# Patient Record
Sex: Female | Born: 1960 | Race: Black or African American | Hispanic: No | Smoking: Current some day smoker
Health system: Southern US, Community
[De-identification: ages and names within clinical notes are randomized; demographics above are authoritative.]

## PROBLEM LIST (undated history)

## (undated) DIAGNOSIS — E119 Type 2 diabetes mellitus without complications: Secondary | ICD-10-CM

## (undated) HISTORY — PX: HYSTERECTOMY ABDOMINAL WITH SALPINGO-OOPHORECTOMY: SHX6792

---

## 2020-02-18 ENCOUNTER — Ambulatory Visit: Payer: Self-pay | Attending: Internal Medicine

## 2020-02-18 DIAGNOSIS — Z23 Encounter for immunization: Secondary | ICD-10-CM

## 2020-03-14 ENCOUNTER — Ambulatory Visit: Payer: Self-pay

## 2020-03-22 ENCOUNTER — Ambulatory Visit: Payer: Self-pay

## 2020-03-22 ENCOUNTER — Ambulatory Visit: Payer: Self-pay | Attending: Internal Medicine

## 2020-03-22 DIAGNOSIS — Z23 Encounter for immunization: Secondary | ICD-10-CM

## 2020-03-22 NOTE — Progress Notes (Signed)
   Covid-19 Vaccination Clinic  Name:  Kim Patel    MRN: [id number] DOB: 11-21-1961  03/22/2020  Kim Patel was observed post Covid-19 immunization for 15 minutes without incident. She was provided with Vaccine Information Sheet and instruction to access the V-Safe system.   Kim Patel was instructed to call 911 with any severe reactions post vaccine: Marland Kitchen Difficulty breathing  . Swelling of face and throat  . A fast heartbeat  . A bad rash all over body  . Dizziness and weakness   Immunizations Administered    Name Date Dose VIS Date Route   Pfizer COVID-19 Vaccine 03/22/2020  3:46 PM 0.3 mL 01/20/2019 Intramuscular   Manufacturer: [employer], [employer]   Lot: [id number]   NDC: [address]-0

## 2020-11-03 ENCOUNTER — Ambulatory Visit: Payer: Self-pay | Attending: Internal Medicine

## 2020-11-03 DIAGNOSIS — Z23 Encounter for immunization: Secondary | ICD-10-CM

## 2020-11-03 NOTE — Progress Notes (Signed)
   Covid-19 Vaccination Clinic  Name:  Kim Patel    MRN: [id number] DOB: Apr 09, 1961  11/03/2020  [name] was observed post Covid-19 immunization for 15 minutes without incident. She was provided with Vaccine Information Sheet and instruction to access the V-Safe system.   [name] was instructed to call 911 with any severe reactions post vaccine: Marland Kitchen Difficulty breathing  . Swelling of face and throat  . A fast heartbeat  . A bad rash all over body  . Dizziness and weakness   Immunizations Administered    Name Date Dose VIS Date Route   Pfizer COVID-19 Vaccine 11/03/2020  4:04 PM 0.3 mL 09/14/2020 Intramuscular   Manufacturer: [employer], [employer]   Lot: [id number]   NDC: [address]-2

## 2021-04-14 ENCOUNTER — Other Ambulatory Visit: Payer: Self-pay

## 2021-04-14 ENCOUNTER — Encounter (HOSPITAL_BASED_OUTPATIENT_CLINIC_OR_DEPARTMENT_OTHER): Payer: Self-pay | Admitting: Obstetrics and Gynecology

## 2021-04-14 ENCOUNTER — Emergency Department (HOSPITAL_BASED_OUTPATIENT_CLINIC_OR_DEPARTMENT_OTHER)
Admission: EM | Admit: 2021-04-14 | Discharge: 2021-04-14 | Disposition: A | Discharge status: Home / Self Care | Payer: 59 | Attending: Emergency Medicine | Admitting: Emergency Medicine

## 2021-04-14 ENCOUNTER — Emergency Department (HOSPITAL_BASED_OUTPATIENT_CLINIC_OR_DEPARTMENT_OTHER): Payer: 59

## 2021-04-14 DIAGNOSIS — F17210 Nicotine dependence, cigarettes, uncomplicated: Secondary | ICD-10-CM | POA: Insufficient documentation

## 2021-04-14 DIAGNOSIS — K56609 Unspecified intestinal obstruction, unspecified as to partial versus complete obstruction: Secondary | ICD-10-CM | POA: Insufficient documentation

## 2021-04-14 DIAGNOSIS — E119 Type 2 diabetes mellitus without complications: Secondary | ICD-10-CM | POA: Insufficient documentation

## 2021-04-14 DIAGNOSIS — R1084 Generalized abdominal pain: Secondary | ICD-10-CM | POA: Diagnosis present

## 2021-04-14 HISTORY — DX: Type 2 diabetes mellitus without complications: E11.9

## 2021-04-14 LAB — COMPREHENSIVE METABOLIC PANEL
ALT: 10 U/L (ref 0–44)
AST: 15 U/L (ref 15–41)
Albumin: 4.5 g/dL (ref 3.5–5.0)
Alkaline Phosphatase: 76 U/L (ref 38–126)
Anion gap: 10 (ref 5–15)
BUN: 11 mg/dL (ref 6–20)
CO2: 24 mmol/L (ref 22–32)
Calcium: 12.2 mg/dL — ABNORMAL HIGH (ref 8.9–10.3)
Chloride: 97 mmol/L — ABNORMAL LOW (ref 98–111)
Creatinine, Ser: 0.69 mg/dL (ref 0.44–1.00)
GFR, Estimated: >60 mL/min (ref >60)
Glucose, Bld: 338 mg/dL — ABNORMAL HIGH (ref 70–99)
Potassium: 4.7 mmol/L (ref 3.5–5.1)
Sodium: 131 mmol/L — ABNORMAL LOW (ref 135–145)
Total Bilirubin: 0.4 mg/dL (ref 0.3–1.2)
Total Protein: 8.9 g/dL — ABNORMAL HIGH (ref 6.5–8.1)

## 2021-04-14 LAB — URINALYSIS, ROUTINE W REFLEX MICROSCOPIC
Bilirubin Urine: NEGATIVE
Glucose, UA: >1000 mg/dL — AB
Hgb urine dipstick: NEGATIVE
Ketones, ur: 40 mg/dL — AB
Leukocytes,Ua: NEGATIVE
Nitrite: NEGATIVE
Protein, ur: 30 mg/dL — AB
Specific Gravity, Urine: 1.037 — ABNORMAL HIGH (ref 1.005–1.030)
pH: 5.5 (ref 5.0–8.0)

## 2021-04-14 LAB — CBC
HCT: 44.1 % (ref 36.0–46.0)
Hemoglobin: 14.3 g/dL (ref 12.0–15.0)
MCH: 27.9 pg (ref 26.0–34.0)
MCHC: 32.4 g/dL (ref 30.0–36.0)
MCV: 86.1 fL (ref 80.0–100.0)
Platelets: 209 10*3/uL (ref 150–400)
RBC: 5.12 MIL/uL — ABNORMAL HIGH (ref 3.87–5.11)
RDW: 13 % (ref 11.5–15.5)
WBC: 11 10*3/uL — ABNORMAL HIGH (ref 4.0–10.5)
nRBC: 0 % (ref 0.0–0.2)

## 2021-04-14 LAB — LIPASE, BLOOD: Lipase: 28 U/L (ref 11–51)

## 2021-04-14 LAB — PREGNANCY, URINE: Preg Test, Ur: NEGATIVE

## 2021-04-14 MED ORDER — FENTANYL CITRATE (PF) 100 MCG/2ML IJ SOLN
50.0000 ug | Freq: Once | INTRAMUSCULAR | Status: AC
  Administered 2021-04-14: 50 ug via INTRAVENOUS
  Filled 2021-04-14: qty 2

## 2021-04-14 MED ORDER — ONDANSETRON 4 MG PO TBDP: 4.0000 mg | ORAL_TABLET | Freq: Three times a day (TID) | ORAL | 0 refills | Status: AC | PRN

## 2021-04-14 MED ORDER — HYDROCODONE-ACETAMINOPHEN 5-325 MG PO TABS: 1.0000 | ORAL_TABLET | Freq: Four times a day (QID) | ORAL | 0 refills | Status: AC | PRN

## 2021-04-14 MED ORDER — SODIUM CHLORIDE 0.9 % IV BOLUS
1000.0000 mL | Freq: Once | INTRAVENOUS | Status: AC
  Administered 2021-04-14: 1000 mL via INTRAVENOUS

## 2021-04-14 MED ORDER — ONDANSETRON HCL 4 MG/2ML IJ SOLN
4.0000 mg | Freq: Once | INTRAMUSCULAR | Status: AC | PRN
  Administered 2021-04-14: 4 mg via INTRAVENOUS
  Filled 2021-04-14: qty 2

## 2021-04-14 NOTE — ED Notes (Signed)
Patient transported to CT 

## 2021-04-14 NOTE — ED Notes (Signed)
Pt verbalizes understanding of discharge instructions. Opportunity for questioning and answers were provided. Armand removed by staff, pt discharged from ED to home. Pt explained the risks of leaving and to come back if sx worsen.

## 2021-04-14 NOTE — ED Triage Notes (Signed)
Pt reports to the ER for abdominal pain since 1530. Patient reports she has taken 2 tums and has since thrown those up. Patient denies diarrhea and reports a normal BM this morning.  Patient denies urinary symptoms but reports a 'heavy' feeling in her abdomen

## 2021-04-14 NOTE — ED Provider Notes (Signed)
[facility] EMERGENCY DEPARTMENT Provider Note  CSN: [id number] Arrival date & time: 04/14/21 1936    History Chief Complaint  Patient presents with  . Abdominal Pain    HPI  Kim Patel is a 60 y.o. female with history of DM, managed with insulin at home but she does not check her glucose regularly reports onset of diffuse lower abdominal pain about 1530hrs today, she took some tums and pepto and went to lie down at home but she then threw up and pain has been getting progressively worse. She has not had any other vomiting, no diarrhea. Last BM was this morning. She denies any urinary symptoms. She did not know she had a fever.    Past Medical History:  Diagnosis Date  . Diabetes mellitus without complication [facility])     Past Surgical History:  Procedure Laterality Date  . HYSTERECTOMY ABDOMINAL WITH SALPINGO-OOPHORECTOMY      No family history on file.  Social History   Tobacco Use  . Smoking status: Current Some Day Smoker    Packs/day: 0.75    Years: 20.00    Pack years: 15.00    Types: Cigarettes  . Smokeless tobacco: Never Used  Vaping Use  . Vaping Use: Never used  Substance Use Topics  . Alcohol use: Not Currently  . Drug use: Not Currently     Home Medications Prior to Admission medications   Not on File     Allergies    Latex and Sulfa antibiotics   Review of Systems   Review of Systems A comprehensive review of systems was completed and negative except as noted in HPI.    Physical Exam BP (!) 167/86   Pulse 97   Temp (!) 100.8 F (38.2 C) (Oral)   Resp 16   Ht 5\' 6"  (1.676 m)   Wt 63.5 kg   SpO2 100%   BMI 22.60 kg/m   Physical Exam Vitals and nursing note reviewed.  Constitutional:      Appearance: Normal appearance.  HENT:     Head: Normocephalic and atraumatic.     Nose: Nose normal.     Mouth/Throat:     Mouth: Mucous membranes are moist.  Eyes:     Extraocular Movements: Extraocular movements intact.      Conjunctiva/sclera: Conjunctivae normal.  Cardiovascular:     Rate and Rhythm: Normal rate.  Pulmonary:     Effort: Pulmonary effort is normal.     Breath sounds: Normal breath sounds.  Abdominal:     General: Abdomen is flat.     Palpations: Abdomen is soft.     Tenderness: There is abdominal tenderness in the suprapubic area and left lower quadrant. There is guarding. Negative signs include Murphy's sign and McBurney's sign.  Musculoskeletal:        General: No swelling. Normal range of motion.     Cervical back: Neck supple.  Skin:    General: Skin is warm and dry.  Neurological:     General: No focal deficit present.     Mental Status: She is alert.  Psychiatric:        Mood and Affect: Mood normal.      ED Results / Procedures / Treatments   Labs (all labs ordered are listed, but only abnormal results are displayed) Labs Reviewed  COMPREHENSIVE METABOLIC PANEL - Abnormal; Notable for the following components:      Result Value   Sodium 131 (*)    Chloride 97 (*)  Glucose, Bld 338 (*)    Calcium 12.2 (*)    Total Protein 8.9 (*)    All other components within normal limits  CBC - Abnormal; Notable for the following components:   WBC 11.0 (*)    RBC 5.12 (*)    All other components within normal limits  URINALYSIS, ROUTINE W REFLEX MICROSCOPIC - Abnormal; Notable for the following components:   Specific Gravity, Urine 1.037 (*)    Glucose, UA >1,000 (*)    Ketones, ur 40 (*)    Protein, ur 30 (*)    All other components within normal limits  LIPASE, BLOOD  PREGNANCY, URINE    EKG None   Radiology CT Abdomen Pelvis Wo Contrast  Result Date: 04/14/2021 CLINICAL DATA:  Acute generalized abdominal pain. EXAM: CT ABDOMEN AND PELVIS WITHOUT CONTRAST TECHNIQUE: Multidetector CT imaging of the abdomen and pelvis was performed following the standard protocol without IV contrast. COMPARISON:  None. FINDINGS: Lower chest: No acute abnormality. Hepatobiliary: No  focal liver abnormality is seen. No gallstones, gallbladder wall thickening, or biliary dilatation. Pancreas: Unremarkable. No pancreatic ductal dilatation or surrounding inflammatory changes. Spleen: Normal in size without focal abnormality. Adrenals/Urinary Tract: Adrenal glands are unremarkable. Kidneys are normal, without renal calculi, focal lesion, or hydronephrosis. Bladder is unremarkable. Stomach/Bowel: The stomach and appendix appear normal. The colon is unremarkable. Mildly dilated and fluid filled small bowel loops are noted in the pelvis concerning for distal small bowel obstruction or possibly focal ileus. Vascular/Lymphatic: Aortic atherosclerosis. No enlarged abdominal or pelvic lymph nodes. Reproductive: Status post hysterectomy. No adnexal masses. Other: No abdominal wall hernia or abnormality. No abdominopelvic ascites. Musculoskeletal: No acute or significant osseous findings. IMPRESSION: Mildly dilated and fluid-filled small bowel loops are noted in the lower abdomen and pelvis with some fecalization present, concerning for distal small bowel obstruction or possibly ileus. Electronically Signed   By: Lupita Raider M.D.   On: 04/14/2021 20:50    Procedures Procedures  Medications Ordered in the ED Medications  ondansetron [facility]) injection 4 mg (4 mg Intravenous Given 04/14/21 2031)  sodium chloride 0.9 % bolus 1,000 mL (1,000 mLs Intravenous New Bag/Given 04/14/21 2033)  fentaNYL (SUBLIMAZE) injection 50 mcg (50 mcg Intravenous Given 04/14/21 2031)     MDM Rules/Calculators/A&P MDM Patient had labs ordered in Triage, no concerning findings other than hyperglycemia, mild leukocytosis. Will give some pain medications, IVF and check a CT.  ED Course  I have reviewed the triage vital signs and the nursing notes.  Pertinent labs & imaging results that were available during my care of the patient were reviewed by me and considered in my medical decision making (see chart for  details).  Clinical Course as of 04/14/21 2310  Fri Apr 14, 2021  2117 CT shows dilated loops of small bowel concerning for SBO vs ileus. Patient has not had any vomiting.  [CS]  2118 CMP with hyperglycemia without anion gap. Small ketones in urine but otherwise no signs of infection.  [CS]  2212 Reviewed results with the patient. Recommended admission for further management but she states she cannot stay due to obligations at home. Recommend clear liquid diet, will Rx pain/nausea meds for symptom improvement. Advised her to return to the ER as soon as she is able to be admitted if her symptoms persist.  [CS]    Clinical Course User Index [CS] Pollyann Savoy, MD    Final Clinical Impression(s) / ED Diagnoses Final diagnoses:  Small bowel obstruction (HCC)  Rx / DC Orders ED Discharge Orders    None       Pollyann Savoy, MD 04/14/21 2310

## 2021-04-14 NOTE — ED Notes (Signed)
Patient returned from CT

## 2021-04-15 ENCOUNTER — Encounter (HOSPITAL_COMMUNITY): Payer: Self-pay | Admitting: Emergency Medicine

## 2021-04-15 ENCOUNTER — Emergency Department (HOSPITAL_COMMUNITY): Payer: 59

## 2021-04-15 ENCOUNTER — Emergency Department (HOSPITAL_COMMUNITY)
Admission: EM | Admit: 2021-04-15 | Discharge: 2021-04-15 | Disposition: A | Discharge status: Home / Self Care | Payer: 59 | Attending: Emergency Medicine | Admitting: Emergency Medicine

## 2021-04-15 ENCOUNTER — Other Ambulatory Visit: Payer: Self-pay

## 2021-04-15 DIAGNOSIS — R109 Unspecified abdominal pain: Secondary | ICD-10-CM | POA: Insufficient documentation

## 2021-04-15 DIAGNOSIS — Z91040 Latex allergy status: Secondary | ICD-10-CM | POA: Diagnosis not present

## 2021-04-15 DIAGNOSIS — E119 Type 2 diabetes mellitus without complications: Secondary | ICD-10-CM | POA: Diagnosis not present

## 2021-04-15 DIAGNOSIS — F17210 Nicotine dependence, cigarettes, uncomplicated: Secondary | ICD-10-CM | POA: Insufficient documentation

## 2021-04-15 LAB — CBC WITH DIFFERENTIAL/PLATELET
Abs Immature Granulocytes: 0.03 10*3/uL (ref 0.00–0.07)
Basophils Absolute: 0.1 10*3/uL (ref 0.0–0.1)
Basophils Relative: 1 %
Eosinophils Absolute: 0.1 10*3/uL (ref 0.0–0.5)
Eosinophils Relative: 2 %
HCT: 43.1 % (ref 36.0–46.0)
Hemoglobin: 13.7 g/dL (ref 12.0–15.0)
Immature Granulocytes: 0 %
Lymphocytes Relative: 44 %
Lymphs Abs: 3.3 10*3/uL (ref 0.7–4.0)
MCH: 28.2 pg (ref 26.0–34.0)
MCHC: 31.8 g/dL (ref 30.0–36.0)
MCV: 88.7 fL (ref 80.0–100.0)
Monocytes Absolute: 0.5 10*3/uL (ref 0.1–1.0)
Monocytes Relative: 6 %
Neutro Abs: 3.7 10*3/uL (ref 1.7–7.7)
Neutrophils Relative %: 47 %
Platelets: 217 10*3/uL (ref 150–400)
RBC: 4.86 MIL/uL (ref 3.87–5.11)
RDW: 12.8 % (ref 11.5–15.5)
WBC: 7.7 10*3/uL (ref 4.0–10.5)
nRBC: 0 % (ref 0.0–0.2)

## 2021-04-15 LAB — COMPREHENSIVE METABOLIC PANEL
ALT: 14 U/L (ref 0–44)
AST: 12 U/L — ABNORMAL LOW (ref 15–41)
Albumin: 4 g/dL (ref 3.5–5.0)
Alkaline Phosphatase: 73 U/L (ref 38–126)
Anion gap: 10 (ref 5–15)
BUN: 9 mg/dL (ref 6–20)
CO2: 24 mmol/L (ref 22–32)
Calcium: 10.6 mg/dL — ABNORMAL HIGH (ref 8.9–10.3)
Chloride: 100 mmol/L (ref 98–111)
Creatinine, Ser: 0.54 mg/dL (ref 0.44–1.00)
GFR, Estimated: >60 mL/min (ref >60)
Glucose, Bld: 288 mg/dL — ABNORMAL HIGH (ref 70–99)
Potassium: 3.8 mmol/L (ref 3.5–5.1)
Sodium: 134 mmol/L — ABNORMAL LOW (ref 135–145)
Total Bilirubin: 0.4 mg/dL (ref 0.3–1.2)
Total Protein: 8.1 g/dL (ref 6.5–8.1)

## 2021-04-15 LAB — URINALYSIS, ROUTINE W REFLEX MICROSCOPIC
Bilirubin Urine: NEGATIVE
Glucose, UA: >=500 mg/dL — AB
Hgb urine dipstick: NEGATIVE
Ketones, ur: 20 mg/dL — AB
Leukocytes,Ua: NEGATIVE
Nitrite: NEGATIVE
Protein, ur: NEGATIVE mg/dL
Specific Gravity, Urine: 1.011 (ref 1.005–1.030)
pH: 6 (ref 5.0–8.0)

## 2021-04-15 LAB — LIPASE, BLOOD: Lipase: 32 U/L (ref 11–51)

## 2021-04-15 LAB — LACTIC ACID, PLASMA
Lactic Acid, Venous: 1.2 mmol/L (ref 0.5–1.9)
Lactic Acid, Venous: 1.4 mmol/L (ref 0.5–1.9)

## 2021-04-15 MED ORDER — LACTATED RINGERS IV SOLN: INTRAVENOUS | Status: DC

## 2021-04-15 MED ORDER — LACTATED RINGERS IV BOLUS
500.0000 mL | Freq: Once | INTRAVENOUS | Status: AC
  Administered 2021-04-15: 500 mL via INTRAVENOUS

## 2021-04-15 MED ORDER — PANTOPRAZOLE SODIUM 20 MG PO TBEC: 20.0000 mg | DELAYED_RELEASE_TABLET | Freq: Every day | ORAL | 0 refills | Status: AC

## 2021-04-15 NOTE — Discharge Instructions (Signed)
1.  At this time abdominal pain seems to have resolved.  Currently there is no sign of a bowel obstruction. 2.  Start Protonix daily to help with any stomach acid or reflux symptoms.  Follow dietary instructions outlined in your discharge instructions. 3.  Schedule recheck with your family doctor within the next 3 to 5 days 4.  If you get return of severe or sudden pain or other worsening symptoms return to the emergency department for recheck.

## 2021-04-15 NOTE — ED Triage Notes (Addendum)
Patient was seen last night at [facility] for abdominal pain, had a CT scan which revealed a small bowel obstruction. Patient was supposed to be admitted but was discharged because she was had to go back home. She reports she had a telehealth appointment today and was instructed to go to ED to be admitted for the the obstruction.

## 2021-04-15 NOTE — ED Notes (Signed)
This RN went into patient's room to restart pump. Patient was standing in room, dressed in her clothes and states to this RN, "I would like to leave." Reviewed patient's chart, and informed primary RN and MD of patient's request. IV removed per patient request. Patient states, "I haven't eaten all day and I've missed 3 doses of insulin." This RN explained to patient that she would be leaving before her work up if completed and it would be considered leaving against medical advice. Patient verbalized understanding to this RN and signed form. Patient requested a copy of the form, and states, "I'm not refusing treatment." Informed patient that leaving before her work up is completed is considered leaving against medical advice, and that she is refusing further treatment by leaving. Patient states, "Well I have to." Verbalized understanding to patient and patient left department.

## 2021-04-15 NOTE — ED Provider Notes (Signed)
[facility]-EMERGENCY DEPT Provider Note   CSN: [id number] Arrival date & time: 04/15/21  1630     History Chief Complaint  Patient presents with  . Abdominal Pain    Kim Patel is a 60 y.o. female.  HPI Patient reports yesterday when she was seen at droppage she could not be admitted to the hospital.  She reports she had to many things to do when she did not know how long that she would be in the hospital.  She reports she was not prepared for hospitalization and did not have her insulin or her other personal items.  Patient reports has been taking fluids all day.  She reports she is have a lot of broth to drink.  She is had no vomiting.  She reports that the pain is gone and it did not come back.  Reports she called into a telehealth appointment and they advised the patient should return to the emergency department for reassessment.    Past Medical History:  Diagnosis Date  . Diabetes mellitus without complication (HCC)     There are no problems to display for this patient.   Past Surgical History:  Procedure Laterality Date  . HYSTERECTOMY ABDOMINAL WITH SALPINGO-OOPHORECTOMY       OB History    Gravida  0   Para  0   Term  0   Preterm  0   AB  0   Living  0     SAB  0   IAB  0   Ectopic  0   Multiple  0   Live Births  0           History reviewed. No pertinent family history.  Social History   Tobacco Use  . Smoking status: Current Some Day Smoker    Packs/day: 0.75    Years: 20.00    Pack years: 15.00    Types: Cigarettes  . Smokeless tobacco: Never Used  Vaping Use  . Vaping Use: Never used  Substance Use Topics  . Alcohol use: Not Currently  . Drug use: Not Currently    Home Medications Prior to Admission medications   Medication Sig Start Date End Date Taking? Authorizing Provider  pantoprazole (PROTONIX) 20 MG tablet Take 1 tablet (20 mg total) by mouth daily. 04/15/21  Yes Arby Barrette, MD   HYDROcodone-acetaminophen (NORCO/VICODIN) 5-325 MG tablet Take 1 tablet by mouth every 6 (six) hours as needed for severe pain. 04/14/21   Pollyann Savoy, MD  ondansetron (ZOFRAN ODT) 4 MG disintegrating tablet Take 1 tablet (4 mg total) by mouth every 8 (eight) hours as needed for nausea or vomiting. 04/14/21   Pollyann Savoy, MD    Allergies    Latex and Sulfa antibiotics  Review of Systems   Review of Systems 10 systems reviewed and negative except as per HPI Physical Exam Updated Vital Signs BP 129/79   Pulse 82   Temp 99.3 F (37.4 C)   Resp 18   SpO2 100%   Physical Exam Constitutional:      Comments: Alert nontoxic well in appearance.  No distress  HENT:     Mouth/Throat:     Pharynx: Oropharynx is clear.  Eyes:     Extraocular Movements: Extraocular movements intact.  Cardiovascular:     Rate and Rhythm: Normal rate and regular rhythm.  Pulmonary:     Effort: Pulmonary effort is normal.     Breath sounds: Normal breath sounds.  Abdominal:     General: There is no distension.     Palpations: Abdomen is soft.     Tenderness: There is no abdominal tenderness. There is no guarding.  Musculoskeletal:        General: No swelling or tenderness. Normal range of motion.     Right lower leg: No edema.     Left lower leg: No edema.  Skin:    General: Skin is warm and dry.  Neurological:     General: No focal deficit present.     Motor: No weakness.     Coordination: Coordination normal.     ED Results / Procedures / Treatments   Labs (all labs ordered are listed, but only abnormal results are displayed) Labs Reviewed  COMPREHENSIVE METABOLIC PANEL - Abnormal; Notable for the following components:      Result Value   Sodium 134 (*)    Glucose, Bld 288 (*)    Calcium 10.6 (*)    AST 12 (*)    All other components within normal limits  URINALYSIS, ROUTINE W REFLEX MICROSCOPIC - Abnormal; Notable for the following components:   Glucose, UA >=500 (*)     Ketones, ur 20 (*)    Bacteria, UA RARE (*)    All other components within normal limits  LIPASE, BLOOD  LACTIC ACID, PLASMA  LACTIC ACID, PLASMA  CBC WITH DIFFERENTIAL/PLATELET    EKG None  Radiology CT Abdomen Pelvis Wo Contrast  Result Date: 04/14/2021 CLINICAL DATA:  Acute generalized abdominal pain. EXAM: CT ABDOMEN AND PELVIS WITHOUT CONTRAST TECHNIQUE: Multidetector CT imaging of the abdomen and pelvis was performed following the standard protocol without IV contrast. COMPARISON:  None. FINDINGS: Lower chest: No acute abnormality. Hepatobiliary: No focal liver abnormality is seen. No gallstones, gallbladder wall thickening, or biliary dilatation. Pancreas: Unremarkable. No pancreatic ductal dilatation or surrounding inflammatory changes. Spleen: Normal in size without focal abnormality. Adrenals/Urinary Tract: Adrenal glands are unremarkable. Kidneys are normal, without renal calculi, focal lesion, or hydronephrosis. Bladder is unremarkable. Stomach/Bowel: The stomach and appendix appear normal. The colon is unremarkable. Mildly dilated and fluid filled small bowel loops are noted in the pelvis concerning for distal small bowel obstruction or possibly focal ileus. Vascular/Lymphatic: Aortic atherosclerosis. No enlarged abdominal or pelvic lymph nodes. Reproductive: Status post hysterectomy. No adnexal masses. Other: No abdominal wall hernia or abnormality. No abdominopelvic ascites. Musculoskeletal: No acute or significant osseous findings. IMPRESSION: Mildly dilated and fluid-filled small bowel loops are noted in the lower abdomen and pelvis with some fecalization present, concerning for distal small bowel obstruction or possibly ileus. Electronically Signed   By: Lupita Raider M.D.   On: 04/14/2021 20:50   DG Abd Acute W/Chest  Result Date: 04/15/2021 CLINICAL DATA:  Small bowel obstruction. EXAM: DG ABDOMEN ACUTE WITH 1 VIEW CHEST COMPARISON:  Apr 14, 2021. FINDINGS: There is no  evidence of dilated bowel loops or free intraperitoneal air. No radiopaque calculi or other significant radiographic abnormality is seen. Heart size and mediastinal contours are within normal limits. Both lungs are clear. IMPRESSION: No abnormal bowel gas pattern seen currently. No acute cardiopulmonary disease. Electronically Signed   By: Lupita Raider M.D.   On: 04/15/2021 19:48    Procedures Procedures   Medications Ordered in ED Medications  lactated ringers infusion ( Intravenous Stopped 04/15/21 2130)  lactated ringers bolus 500 mL (0 mLs Intravenous Stopped 04/15/21 2026)    ED Course  I have reviewed the triage vital signs and the  nursing notes.  Pertinent labs & imaging results that were available during my care of the patient were reviewed by me and considered in my medical decision making (see chart for details).    MDM Rules/Calculators/A&P                          Patient returns for recheck today after being diagnosed with ileus versus early small bowel obstruction yesterday.  Yesterday, it was suggested the patient would get admitted to the hospital for observation.  She did not wish to be hospitalized at that time.  She reports she has follow the instructions on a clear liquid diet.  She reports she feels much better and has not had any vomiting and is pain-free.  Abdomen is soft and nontender.  I did obtain a plain film x-ray with no signs of obstructive pattern.  Labs are stable.  Urinalysis negative.  Patient became frustrated before completion of care, removed her IV and reported she needed to sign out AMA in order to get something to eat and take her insulin.  At this time her urine has just returned and no signs of infection.  Patient is stable for discharge.  She wished to leave before reviewing discharge instructions.  She advised that she will review them in her MyChart.  Plan will be for Protonix daily and following dietary measures for GERD as well as diabetes.  I  recommend close follow-up with PCP for recheck.  Patient is to return immediately should she have recurrence have any significant pain or other concerning symptoms. Final Clinical Impression(s) / ED Diagnoses Final diagnoses:  Abdominal pain, unspecified abdominal location    Rx / DC Orders ED Discharge Orders         Ordered    pantoprazole (PROTONIX) 20 MG tablet  Daily        04/15/21 2133           Arby Barrette, MD 04/15/21 2144

## 2022-12-10 IMAGING — CR DG ABDOMEN ACUTE W/ 1V CHEST
3 series · 3 of 3 positions shown · non-contrast
Comparison: April 14, 2021.

CLINICAL DATA: Small bowel obstruction.

EXAM:
DG ABDOMEN ACUTE WITH 1 VIEW CHEST

[w chest pa]
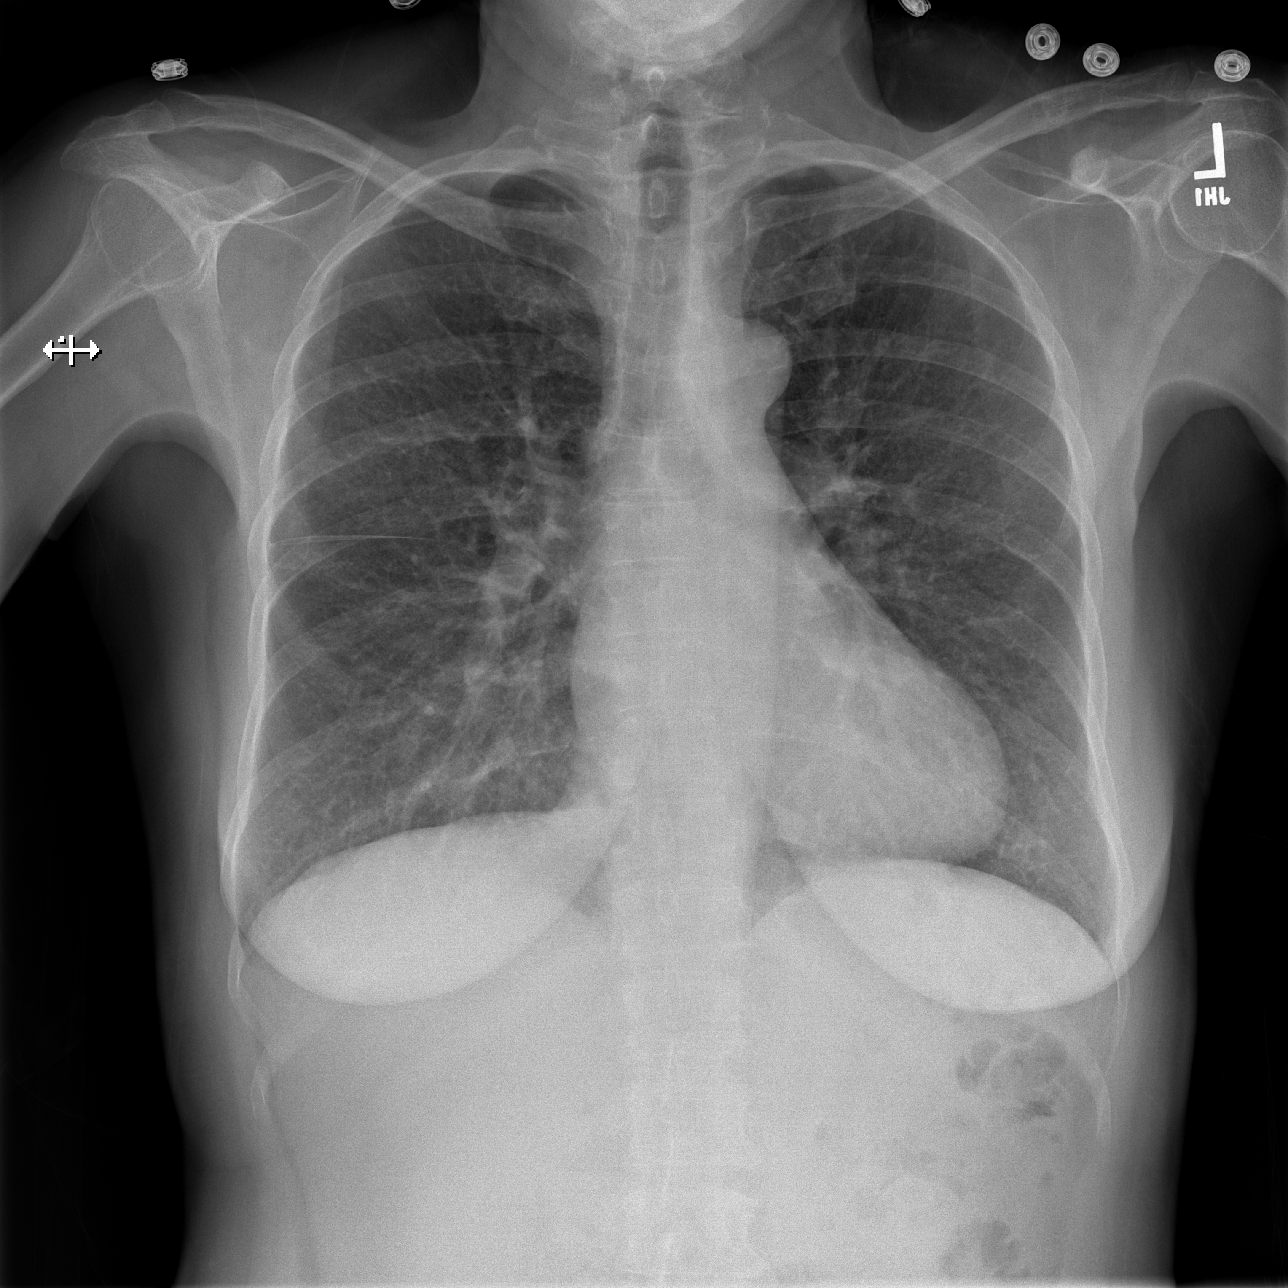

[w abdomen upright]
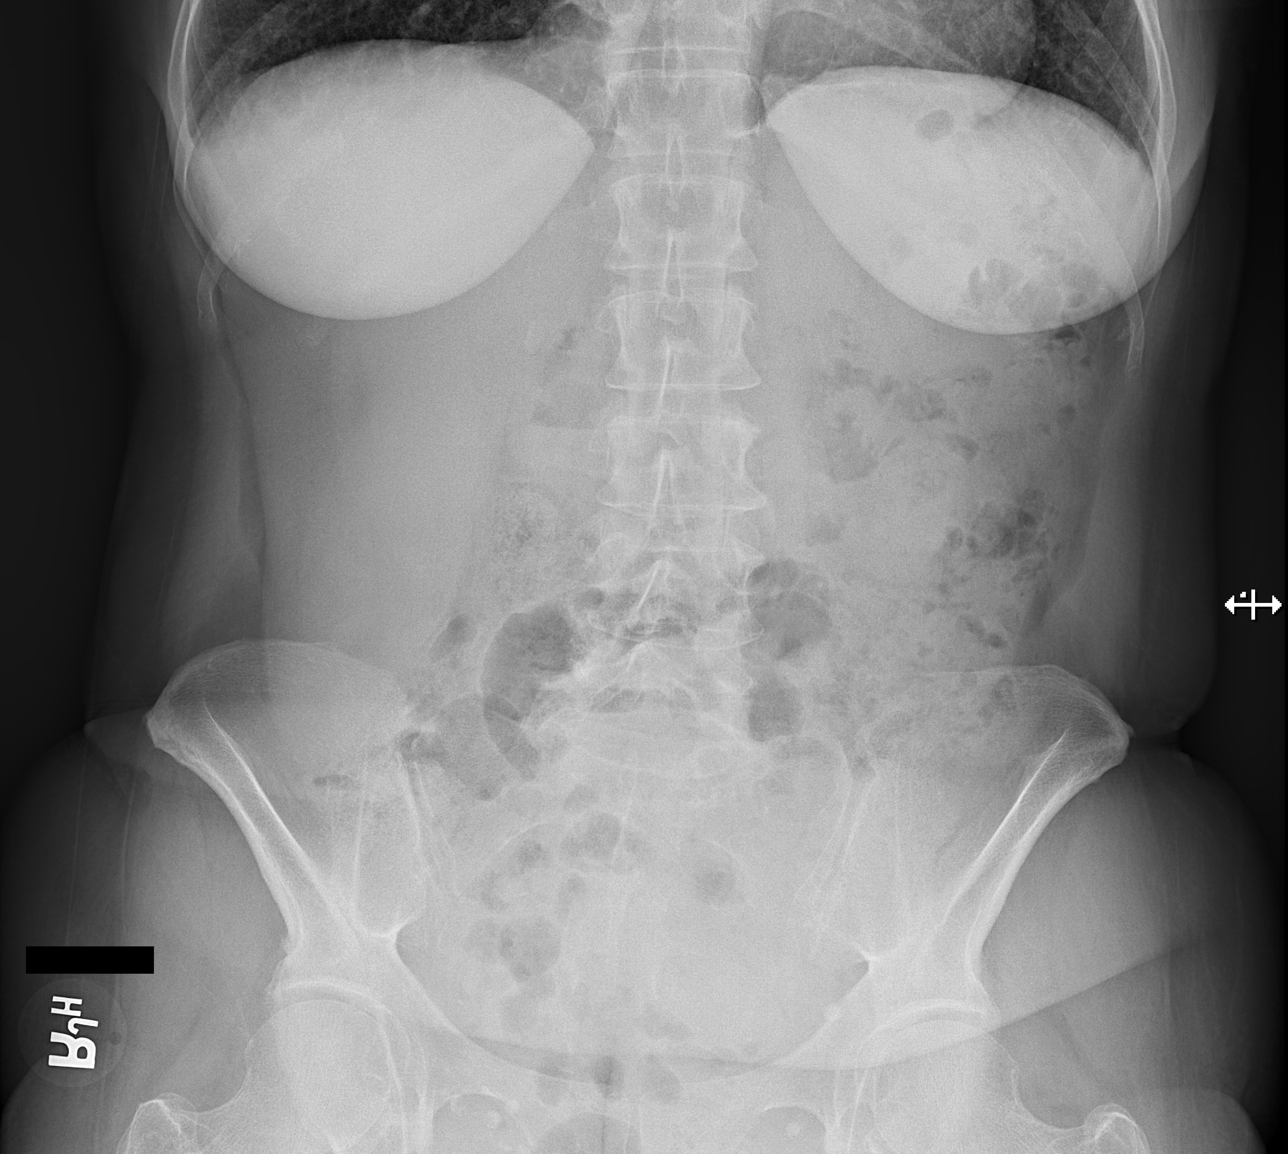

[t abdomen supine]
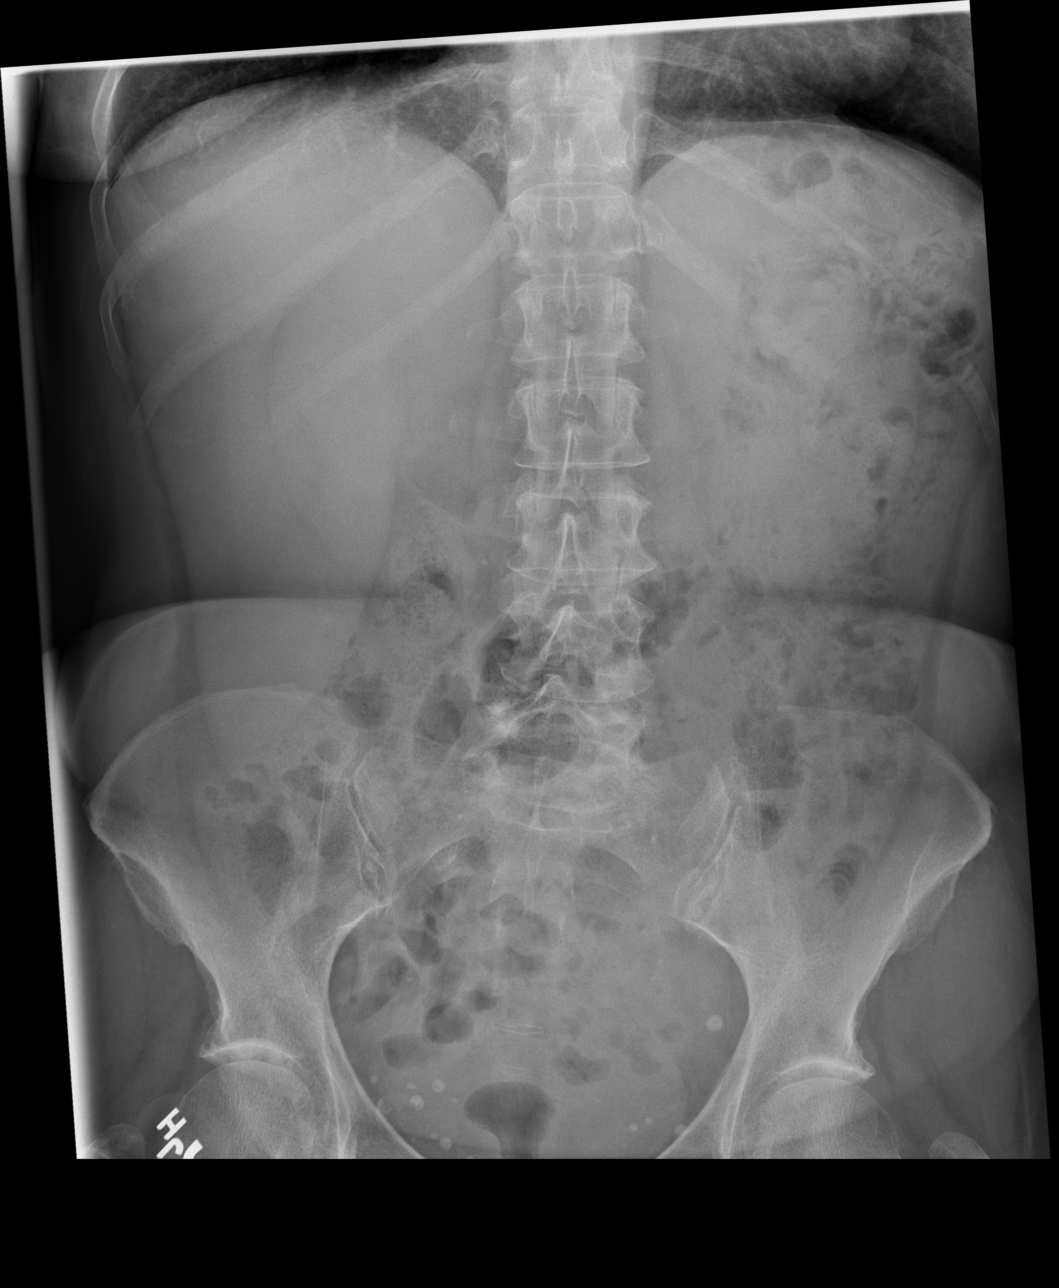

[3 of 3 positions shown; findings below may reference images not displayed]

FINDINGS: There is no evidence of dilated bowel loops or free intraperitoneal
air. No radiopaque calculi or other significant radiographic
abnormality is seen. Heart size and mediastinal contours are within
normal limits. Both lungs are clear.
IMPRESSION: No abnormal bowel gas pattern seen currently. No acute
cardiopulmonary disease.
